# Patient Record
Sex: Female | Born: 2000 | Race: White | Hispanic: No | Marital: Single | Smoking: Never smoker
Health system: Southern US, Academic
[De-identification: ages and names within clinical notes are randomized; demographics above are authoritative.]

## PROBLEM LIST (undated history)

## (undated) DIAGNOSIS — J45909 Unspecified asthma, uncomplicated: Secondary | ICD-10-CM

## (undated) DIAGNOSIS — T7840XA Allergy, unspecified, initial encounter: Secondary | ICD-10-CM

## (undated) DIAGNOSIS — Z9889 Other specified postprocedural states: Secondary | ICD-10-CM

## (undated) HISTORY — PX: HX APPENDECTOMY: SHX54

---
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
## 2022-07-23 ENCOUNTER — Other Ambulatory Visit: Payer: Self-pay

## 2022-07-23 ENCOUNTER — Emergency Department
Admission: EM | Admit: 2022-07-23 | Discharge: 2022-07-23 | Disposition: A | Discharge status: Home / Self Care | Payer: 59 | Attending: Emergency Medicine | Admitting: Emergency Medicine

## 2022-07-23 ENCOUNTER — Encounter (HOSPITAL_BASED_OUTPATIENT_CLINIC_OR_DEPARTMENT_OTHER): Payer: Self-pay

## 2022-07-23 DIAGNOSIS — T24202A Burn of second degree of unspecified site of left lower limb, except ankle and foot, initial encounter: Secondary | ICD-10-CM | POA: Insufficient documentation

## 2022-07-23 DIAGNOSIS — T24201A Burn of second degree of unspecified site of right lower limb, except ankle and foot, initial encounter: Secondary | ICD-10-CM | POA: Insufficient documentation

## 2022-07-23 DIAGNOSIS — L551 Sunburn of second degree: Secondary | ICD-10-CM | POA: Insufficient documentation

## 2022-07-23 DIAGNOSIS — L03116 Cellulitis of left lower limb: Secondary | ICD-10-CM | POA: Insufficient documentation

## 2022-07-23 HISTORY — DX: Other specified postprocedural states: Z98.890

## 2022-07-23 HISTORY — DX: Unspecified asthma, uncomplicated: J45.909

## 2022-07-23 HISTORY — DX: Allergy, unspecified, initial encounter: T78.40XA

## 2022-07-23 LAB — CBC WITH DIFF
BASOPHIL #: 0.03 10*3/uL (ref 0.00–0.30)
BASOPHIL %: 0 % (ref 0–3)
EOSINOPHIL #: 0.95 10*3/uL — ABNORMAL HIGH (ref 0.00–0.80)
EOSINOPHIL %: 11 % — ABNORMAL HIGH (ref 0–7)
HCT: 38.1 % (ref 37.0–47.0)
HGB: 12.6 g/dL (ref 12.5–16.0)
LYMPHOCYTE #: 3.18 10*3/uL (ref 1.10–5.00)
LYMPHOCYTE %: 37 % (ref 25–45)
MCH: 26.8 pg — ABNORMAL LOW (ref 27.0–32.0)
MCHC: 33.1 g/dL (ref 32.0–36.0)
MCV: 80.8 fL (ref 78.0–99.0)
MONOCYTE #: 0.62 10*3/uL (ref 0.00–1.30)
MONOCYTE %: 7 % (ref 0–12)
MPV: 8.4 fL (ref 7.4–10.4)
NEUTROPHIL #: 3.75 10*3/uL (ref 1.80–8.40)
NEUTROPHIL %: 44 % (ref 40–76)
PLATELETS: 370 10*3/uL (ref 140–440)
RBC: 4.71 10*6/uL (ref 4.20–5.40)
RDW: 16.7 % — ABNORMAL HIGH (ref 11.6–14.8)
WBC: 8.5 10*3/uL (ref 4.0–10.5)

## 2022-07-23 LAB — COMPREHENSIVE METABOLIC PANEL, NON-FASTING
ALBUMIN/GLOBULIN RATIO: 0.9 (ref 0.8–1.4)
ALBUMIN: 3.1 g/dL — ABNORMAL LOW (ref 3.4–5.0)
ALKALINE PHOSPHATASE: 63 U/L (ref 46–116)
ALT (SGPT): 19 U/L (ref <=78)
ANION GAP: 12 mmol/L (ref 4–13)
AST (SGOT): 15 U/L (ref 15–37)
BILIRUBIN TOTAL: 0.3 mg/dL (ref 0.2–1.0)
BUN/CREA RATIO: 8
BUN: 7 mg/dL (ref 7–18)
CALCIUM, CORRECTED: 9.4 mg/dL
CALCIUM: 8.7 mg/dL (ref 8.5–10.1)
CHLORIDE: 105 mmol/L (ref 98–107)
CO2 TOTAL: 26 mmol/L (ref 21–32)
CREATININE: 0.86 mg/dL (ref 0.55–1.02)
ESTIMATED GFR: 100 mL/min/{1.73_m2} (ref >59)
GLOBULIN: 3.4
GLUCOSE: 93 mg/dL (ref 74–106)
OSMOLALITY, CALCULATED: 283 mOsm/kg (ref 270–290)
POTASSIUM: 3.8 mmol/L (ref 3.5–5.1)
PROTEIN TOTAL: 6.5 g/dL (ref 6.4–8.2)
SODIUM: 143 mmol/L (ref 136–145)

## 2022-07-23 LAB — PTT (PARTIAL THROMBOPLASTIN TIME): APTT: 26.5 seconds (ref 22.0–31.7)

## 2022-07-23 MED ORDER — SILVER SULFADIAZINE 1 % TOPICAL CREAM
TOPICAL_CREAM | CUTANEOUS | Status: AC
Start: 2022-07-23 — End: 2022-07-23
  Filled 2022-07-23: qty 50

## 2022-07-23 MED ORDER — SULFAMETHOXAZOLE 800 MG-TRIMETHOPRIM 160 MG TABLET
1.0000 | ORAL_TABLET | ORAL | Status: AC
Start: 2022-07-23 — End: 2022-07-23
  Administered 2022-07-23: 160 mg via ORAL

## 2022-07-23 MED ORDER — SULFAMETHOXAZOLE 800 MG-TRIMETHOPRIM 160 MG TABLET
1.0000 | ORAL_TABLET | Freq: Two times a day (BID) | ORAL | 0 refills | Status: AC
Start: 2022-07-23 — End: 2022-08-02

## 2022-07-23 MED ORDER — KETOROLAC 60 MG/2 ML INTRAMUSCULAR SOLUTION
INTRAMUSCULAR | Status: AC
Start: 2022-07-23 — End: 2022-07-23
  Filled 2022-07-23: qty 2

## 2022-07-23 MED ORDER — SILVER SULFADIAZINE 1 % TOPICAL CREAM
TOPICAL_CREAM | Freq: Every day | CUTANEOUS | Status: DC
Start: 2022-07-24 — End: 2022-07-24

## 2022-07-23 MED ORDER — SULFAMETHOXAZOLE 800 MG-TRIMETHOPRIM 160 MG TABLET
ORAL_TABLET | ORAL | Status: AC
Start: 2022-07-23 — End: 2022-07-23
  Filled 2022-07-23: qty 1

## 2022-07-23 MED ORDER — KETOROLAC 60 MG/2 ML INTRAMUSCULAR SOLUTION
60.0000 mg | INTRAMUSCULAR | Status: AC
Start: 2022-07-23 — End: 2022-07-23
  Administered 2022-07-23: 60 mg via INTRAMUSCULAR

## 2022-07-23 MED ORDER — DEXAMETHASONE SODIUM PHOSPHATE (PF) 10 MG/ML INJECTION SOLUTION
10.0000 mg | INTRAMUSCULAR | Status: AC
Start: 2022-07-23 — End: 2022-07-23
  Administered 2022-07-23: 10 mg via INTRAMUSCULAR

## 2022-07-23 MED ORDER — DEXAMETHASONE SODIUM PHOSPHATE (PF) 10 MG/ML INJECTION SOLUTION
INTRAMUSCULAR | Status: AC
Start: 2022-07-23 — End: 2022-07-23
  Filled 2022-07-23: qty 1

## 2022-07-23 MED ORDER — KETOROLAC 10 MG TABLET
10.0000 mg | ORAL_TABLET | Freq: Four times a day (QID) | ORAL | 0 refills | Status: AC | PRN
Start: 2022-07-23 — End: ?

## 2022-07-23 MED ORDER — SILVER SULFADIAZINE 1 % TOPICAL CREAM
TOPICAL_CREAM | Freq: Two times a day (BID) | CUTANEOUS | 0 refills | Status: AC
Start: 2022-07-23 — End: ?

## 2022-07-23 NOTE — ED Nurses Note (Signed)
Right lower leg measured 16.75 cm  Left lower leg measured 17.5 cm   Provider notified
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
## 2022-07-24 ENCOUNTER — Encounter (HOSPITAL_BASED_OUTPATIENT_CLINIC_OR_DEPARTMENT_OTHER): Payer: Self-pay

## 2022-07-24 ENCOUNTER — Other Ambulatory Visit: Payer: Self-pay

## 2022-07-24 ENCOUNTER — Emergency Department
Admission: EM | Admit: 2022-07-24 | Discharge: 2022-07-24 | Disposition: A | Discharge status: Home / Self Care | Payer: 59 | Attending: Family | Admitting: Family

## 2022-07-24 DIAGNOSIS — X32XXXS Exposure to sunlight, sequela: Secondary | ICD-10-CM | POA: Insufficient documentation

## 2022-07-24 DIAGNOSIS — T24232A Burn of second degree of left lower leg, initial encounter: Secondary | ICD-10-CM

## 2022-07-24 DIAGNOSIS — L03116 Cellulitis of left lower limb: Secondary | ICD-10-CM | POA: Insufficient documentation

## 2022-07-24 DIAGNOSIS — T24202S Burn of second degree of unspecified site of left lower limb, except ankle and foot, sequela: Secondary | ICD-10-CM
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
